# Patient Record
Sex: Male | Born: 1995 | Race: Black or African American | Hispanic: No | Marital: Single | Smoking: Never smoker
Health system: Southern US, Community
[De-identification: ages and names within clinical notes are randomized; demographics above are authoritative.]

## PROBLEM LIST (undated history)

## (undated) DIAGNOSIS — R002 Palpitations: Secondary | ICD-10-CM

## (undated) DIAGNOSIS — R569 Unspecified convulsions: Secondary | ICD-10-CM

---
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
## 2017-09-26 ENCOUNTER — Emergency Department (HOSPITAL_COMMUNITY)
Admission: EM | Admit: 2017-09-26 | Discharge: 2017-09-26 | Disposition: A | Discharge status: Home / Self Care | Payer: BLUE CROSS/BLUE SHIELD | Attending: Emergency Medicine | Admitting: Emergency Medicine

## 2017-09-26 ENCOUNTER — Encounter (HOSPITAL_COMMUNITY): Payer: Self-pay | Admitting: *Deleted

## 2017-09-26 ENCOUNTER — Other Ambulatory Visit: Payer: Self-pay

## 2017-09-26 DIAGNOSIS — R002 Palpitations: Secondary | ICD-10-CM | POA: Diagnosis present

## 2017-09-26 LAB — CBC WITH DIFFERENTIAL/PLATELET
Basophils Absolute: 0 10*3/uL (ref 0.0–0.1)
Basophils Relative: 0 %
Eosinophils Absolute: 0 10*3/uL (ref 0.0–0.7)
Eosinophils Relative: 0 %
HCT: 38.2 % — ABNORMAL LOW (ref 39.0–52.0)
Hemoglobin: 12.7 g/dL — ABNORMAL LOW (ref 13.0–17.0)
Lymphocytes Relative: 29 %
Lymphs Abs: 2.5 10*3/uL (ref 0.7–4.0)
MCH: 27.1 pg (ref 26.0–34.0)
MCHC: 33.2 g/dL (ref 30.0–36.0)
MCV: 81.6 fL (ref 78.0–100.0)
Monocytes Absolute: 0.6 10*3/uL (ref 0.1–1.0)
Monocytes Relative: 7 %
Neutro Abs: 5.4 10*3/uL (ref 1.7–7.7)
Neutrophils Relative %: 64 %
Platelets: 333 10*3/uL (ref 150–400)
RBC: 4.68 MIL/uL (ref 4.22–5.81)
RDW: 14.1 % (ref 11.5–15.5)
WBC: 8.5 10*3/uL (ref 4.0–10.5)

## 2017-09-26 LAB — BASIC METABOLIC PANEL
Anion gap: 8 (ref 5–15)
BUN: 8 mg/dL (ref 6–20)
CO2: 26 mmol/L (ref 22–32)
Calcium: 9.1 mg/dL (ref 8.9–10.3)
Chloride: 103 mmol/L (ref 101–111)
Creatinine, Ser: 1.07 mg/dL (ref 0.61–1.24)
GFR calc Af Amer: >60 mL/min (ref >60)
GFR calc non Af Amer: >60 mL/min (ref >60)
Glucose, Bld: 125 mg/dL — ABNORMAL HIGH (ref 65–99)
Potassium: 3 mmol/L — ABNORMAL LOW (ref 3.5–5.1)
Sodium: 137 mmol/L (ref 135–145)

## 2017-09-26 MED ORDER — POTASSIUM CHLORIDE CRYS ER 20 MEQ PO TBCR
60.0000 meq | EXTENDED_RELEASE_TABLET | Freq: Once | ORAL | Status: AC
  Administered 2017-09-26: 60 meq via ORAL
  Filled 2017-09-26: qty 3

## 2017-09-26 NOTE — ED Triage Notes (Signed)
Pt states he ate a bowl of cereal today and after lying down he noticed he started having heart palpitations and abdominal pain with some nausea

## 2017-10-01 NOTE — ED Provider Notes (Signed)
[facility] EMERGENCY DEPARTMENT Provider Note   CSN: [id number] Arrival date & time: 09/26/17  2021     History   Chief Complaint Chief Complaint  Patient presents with  . Palpitations    HPI Brandon Rose is a 22 y.o. male.  HPI   22 year old male with palpitations.  Onset while laying down shortly after eating earlier today.  He felt like his heart was racing.  Mild nausea.  Denies any pain.  No dyspnea.  Has a past history of palpitations but states that this felt a little bit different.  Currently feeling better.  He was in his usual state of health over the past few days.  No unusual leg pain or swelling.  No fevers or chills.  No cough.  Past Medical History:  Diagnosis Date  . Heart palpitations   . Seizures (HCC)    as a infant    There are no active problems to display for this patient.   History reviewed. No pertinent surgical history.      Home Medications    Prior to Admission medications   Not on File    Family History History reviewed. No pertinent family history.  Social History Social History   Tobacco Use  . Smoking status: Never Smoker  . Smokeless tobacco: Never Used  Substance Use Topics  . Alcohol use: No  . Drug use: No     Allergies   Patient has no known allergies.   Review of Systems Review of Systems  All systems reviewed and negative, other than as noted in HPI.  Physical Exam Updated Vital Signs BP (!) 98/51   Pulse 72   Temp 98.8 F (37.1 C) (Oral)   Resp (!) 25   Wt 63.5 kg (140 lb)   SpO2 100%   Physical Exam  Constitutional: He appears well-developed and well-nourished. No distress.  HENT:  Head: Normocephalic and atraumatic.  Eyes: Conjunctivae are normal. Right eye exhibits no discharge. Left eye exhibits no discharge.  Neck: Neck supple.  Cardiovascular: Normal rate, regular rhythm and normal heart sounds. Exam reveals no gallop and no friction rub.  No murmur heard. Pulmonary/Chest: Effort  normal and breath sounds normal. No respiratory distress.  Abdominal: Soft. He exhibits no distension. There is no tenderness.  Musculoskeletal: He exhibits no edema or tenderness.  Neurological: He is alert.  Skin: Skin is warm and dry.  Psychiatric: He has a normal mood and affect. His behavior is normal. Thought content normal.  Nursing note and vitals reviewed.    ED Treatments / Results  Labs (all labs ordered are listed, but only abnormal results are displayed) Labs Reviewed  CBC WITH DIFFERENTIAL/PLATELET - Abnormal; Notable for the following components:      Result Value   Hemoglobin 12.7 (*)    HCT 38.2 (*)    All other components within normal limits  BASIC METABOLIC PANEL - Abnormal; Notable for the following components:   Potassium 3.0 (*)    Glucose, Bld 125 (*)    All other components within normal limits    EKG EKG Interpretation  Date/Time:  Thursday Sep 26 2017 20:42:44 EDT Ventricular Rate:  90 PR Interval:    QRS Duration: 89 QT Interval:  353 QTC Calculation: 432 R Axis:   85 Text Interpretation:  Sinus rhythm Probable left ventricular hypertrophy Confirmed by Ross Marcus ([id number]) on 09/27/2017 7:47:52 PM   Radiology No results found.  Procedures Procedures (including critical care time)  Medications Ordered in  ED Medications  potassium chloride SA (K-DUR,KLOR-CON) CR tablet 60 mEq (60 mEq Oral Given 09/26/17 2206)     Initial Impression / Assessment and Plan / ED Course  I have reviewed the triage vital signs and the nursing notes.  Pertinent labs & imaging results that were available during my care of the patient were reviewed by me and considered in my medical decision making (see chart for details).     22 year old male with palpitations.  He dynamically stable.  Appears well.  Mild hypokalemia.  This is supplemented.  EKG not concerning.  Low suspicion for emergent process.  Return precautions were discussed.  Final Clinical  Impressions(s) / ED Diagnoses   Final diagnoses:  Heart palpitations  Palpitations    ED Discharge Orders    None       Raeford Razor, MD 10/01/17 [phone]

## 2018-06-04 ENCOUNTER — Emergency Department (HOSPITAL_COMMUNITY)
Admission: EM | Admit: 2018-06-04 | Discharge: 2018-06-04 | Disposition: A | Discharge status: Home / Self Care | Payer: Self-pay | Attending: Emergency Medicine | Admitting: Emergency Medicine

## 2018-06-04 ENCOUNTER — Other Ambulatory Visit: Payer: Self-pay

## 2018-06-04 ENCOUNTER — Emergency Department (HOSPITAL_COMMUNITY): Payer: Self-pay

## 2018-06-04 ENCOUNTER — Encounter (HOSPITAL_COMMUNITY): Payer: Self-pay | Admitting: Emergency Medicine

## 2018-06-04 DIAGNOSIS — R002 Palpitations: Secondary | ICD-10-CM | POA: Insufficient documentation

## 2018-06-04 DIAGNOSIS — R0789 Other chest pain: Secondary | ICD-10-CM | POA: Insufficient documentation

## 2018-06-04 LAB — BASIC METABOLIC PANEL
Anion gap: 8 (ref 5–15)
BUN: 12 mg/dL (ref 6–20)
CHLORIDE: 105 mmol/L (ref 98–111)
CO2: 25 mmol/L (ref 22–32)
CREATININE: 0.82 mg/dL (ref 0.61–1.24)
Calcium: 9.3 mg/dL (ref 8.9–10.3)
GFR calc non Af Amer: >60 mL/min (ref >60)
Glucose, Bld: 99 mg/dL (ref 70–99)
POTASSIUM: 3.8 mmol/L (ref 3.5–5.1)
SODIUM: 138 mmol/L (ref 135–145)

## 2018-06-04 LAB — CBC
HEMATOCRIT: 41.7 % (ref 39.0–52.0)
Hemoglobin: 12.9 g/dL — ABNORMAL LOW (ref 13.0–17.0)
MCH: 26.4 pg (ref 26.0–34.0)
MCHC: 30.9 g/dL (ref 30.0–36.0)
MCV: 85.5 fL (ref 80.0–100.0)
NRBC: 0 % (ref 0.0–0.2)
Platelets: 316 10*3/uL (ref 150–400)
RBC: 4.88 MIL/uL (ref 4.22–5.81)
RDW: 14.6 % (ref 11.5–15.5)
WBC: 5.3 10*3/uL (ref 4.0–10.5)

## 2018-06-04 LAB — TROPONIN I: Troponin I: <0.03 ng/mL (ref <0.03)

## 2018-06-04 NOTE — ED Triage Notes (Signed)
Patient c/o palpitations yesterday.

## 2018-06-04 NOTE — ED Provider Notes (Signed)
[facility] EMERGENCY DEPARTMENT Provider Note   CSN: [id number] Arrival date & time: 06/04/18  1705     History   Chief Complaint Chief Complaint  Patient presents with  . Chest Pain    HPI Brandon Rose is a 23 y.o. male with a past medical history of intermittent palpitations presents to ED for palpitations that occurred approximately 24 hours ago yesterday.  States that he was on his break at work when he was sitting down and began experiencing like his heart was pounding, chest tightness.  Reports history of similar symptoms in the past with no specific cause.  He is seen pediatric cardiology 2 years ago with negative and reassuring work-up.  He notes that he still somewhat has the symptoms currently but "not that bad, it was really bad last night."  He denies any fever, URI symptoms, shortness of breath, wheezing, abdominal pain, vomiting, syncope.  He denies any alcohol, tobacco or other drug use.  No family history of sudden cardiac death at a young age.  HPI  Past Medical History:  Diagnosis Date  . Heart palpitations   . Seizures (HCC)    as a infant    There are no active problems to display for this patient.   History reviewed. No pertinent surgical history.      Home Medications    Prior to Admission medications   Not on File    Family History History reviewed. No pertinent family history.  Social History Social History   Tobacco Use  . Smoking status: Never Smoker  . Smokeless tobacco: Never Used  Substance Use Topics  . Alcohol use: No  . Drug use: No     Allergies   Patient has no known allergies.   Review of Systems Review of Systems  Constitutional: Negative for appetite change, chills and fever.  HENT: Negative for ear pain, rhinorrhea, sneezing and sore throat.   Eyes: Negative for photophobia and visual disturbance.  Respiratory: Positive for chest tightness. Negative for cough, shortness of breath and wheezing.     Cardiovascular: Positive for palpitations. Negative for chest pain.  Gastrointestinal: Negative for abdominal pain, blood in stool, constipation, diarrhea, nausea and vomiting.  Genitourinary: Negative for dysuria, hematuria and urgency.  Musculoskeletal: Negative for myalgias.  Skin: Negative for rash.  Neurological: Negative for dizziness, weakness and light-headedness.     Physical Exam Updated Vital Signs BP 140/62 (BP Location: Right Arm)   Pulse 78   Temp 97.9 F (36.6 C) (Oral)   Resp 18   Ht 5\' 9"  (1.753 m)   Wt 68 kg   SpO2 100%   BMI 22.15 kg/m   Physical Exam Vitals signs and nursing note reviewed.  Constitutional:      General: He is not in acute distress.    Appearance: He is well-developed.     Comments: Nontoxic-appearing in no acute distress.  Speaking in complete sentences without difficulty.  HENT:     Head: Normocephalic and atraumatic.     Nose: Nose normal.  Eyes:     General: No scleral icterus.       Left eye: No discharge.     Conjunctiva/sclera: Conjunctivae normal.  Neck:     Musculoskeletal: Normal range of motion and neck supple.  Cardiovascular:     Rate and Rhythm: Normal rate and regular rhythm.     Heart sounds: Normal heart sounds. No murmur. No friction rub. No gallop.   Pulmonary:     Effort: Pulmonary  effort is normal. No respiratory distress.     Breath sounds: Normal breath sounds.  Abdominal:     General: Bowel sounds are normal. There is no distension.     Palpations: Abdomen is soft.     Tenderness: There is no abdominal tenderness. There is no guarding.  Musculoskeletal: Normal range of motion.     Comments: No lower extremity edema, erythema or calf tenderness bilaterally.  Skin:    General: Skin is warm and dry.     Findings: No rash.  Neurological:     Mental Status: He is alert.     Motor: No abnormal muscle tone.     Coordination: Coordination normal.      ED Treatments / Results  Labs (all labs ordered are  listed, but only abnormal results are displayed) Labs Reviewed  CBC - Abnormal; Notable for the following components:      Result Value   Hemoglobin 12.9 (*)    All other components within normal limits  BASIC METABOLIC PANEL  TROPONIN I    EKG None      Radiology Dg Chest 2 View  Result Date: 06/04/2018 CLINICAL DATA:  Palpitations, chest pain and shortness of breath since yesterday EXAM: CHEST - 2 VIEW COMPARISON:  06/24/2015 FINDINGS: Normal heart size, mediastinal contours, and pulmonary vascularity. Lungs clear. No pleural effusion or pneumothorax. Bones unremarkable. IMPRESSION: Normal exam. Electronically Signed   By: Ulyses SouthwardMark  Boles M.D.   On: 06/04/2018 17:35    Procedures Procedures (including critical care time)  Medications Ordered in ED Medications - No data to display   Initial Impression / Assessment and Plan / ED Course  I have reviewed the triage vital signs and the nursing notes.  Pertinent labs & imaging results that were available during my care of the patient were reviewed by me and considered in my medical decision making (see chart for details).  Clinical Course as of Jun 04 2004  Wed Jun 04, 2018  1837 EKG unable to be crossed over.  I have attached an image.  EKG shows normal sinus rhythm, normal EKG.  Signed off by Dr. Charm BargesButler.   [HK]  1858 Notes from peds cardiology visit 05/2015  Impressions   Brandon Rose evaluation is consistent with a normal (a.k.a., innocent) heart murmur. A normal murmur may, however, persist, and be more prominent under conditions of increased hemodynamic stress (e.g., febrile illness, heavy exertion, anemia, or dehydration). As this murmur is a normal finding, it should not impact Brandon Rose general health, growth or development, or athletic performance.  Upon my review, Brandon Rose has had a normal ECG both here and at [facility]. His palpitations are brief, declining in frequency, and likely reflect rare PACs or PVCs. 40 seconds of ECG  monitoring in our office reveal no ectopy. It is normal in the teenage years to start having rare PACs and/or PVCs, and unless frequent or increasing with exercise are not of concern. Brandon Rose' chest pain sounds non-cardiac in origin, and echocardiogram today to evaluate murmur and possible valve click (which can be suggestive of bicuspid aortic valve) demonstrated no cause for chest pain and a normal aortic valve. I do not feel further work-up for the palpitations is warranted at this time, unless they become frequent, increase with exercise, or he experiences sustained palpitations/tachycardia, at which point we could consider further monitoring or exercise testing. I did encourage Brandon Rose to hydrate well throughout the day and especially with activity, and to try to get a good night's sleep.  Recommendations    No further work-up at this time.   I would not limit Brandon Rose in sports participation or physical activity from the perspective of the heart.   No SBE prophylaxis required.  No scheduled follow-up with Pediatric Cardiology is required, however, we would be happy to see Brandon Rose should any new or concerning symptoms develop, or symptoms of palpitations progress, at which point his family can contact us and we can consider monitor or exercise test.     [HK]    Clinical Course User Index [HK] Dietrich Pates, PA-C    23 year old male presents to ED for palpitations, chest tightness that began 24 hours ago.  Patient has been seen and evaluated in the ED about 4 times for similar symptoms in the past 2 to 3 years.  Reassuring work-up noted.  He has also seen the pediatric cardiologist in the past with reassuring work-up.  His symptoms began 24 hours ago and have gradually improved throughout the day.  Patient states that "I am here to just get checked out."  He denies any history of MI, DVT, PE, recent immobilization, shortness of breath or URI symptoms.  On my exam he is overall well-appearing.   Vital signs are within normal limits.  He is not tachycardic, tachypneic or hypoxic.  No lower extremity edema, erythema or calf tenderness that would concern me for DVT.  Chest is not tender to palpation.  Plan is to obtain lab work, imaging and reassess.   EKG shows normal sinus rhythm with no ischemic changes.  Chest x-ray is unremarkable. CBC, BMP, unremarkable.  Troponin is negative.  Feel that symptoms are noncardiac in origin.  Doubt ACS this patient as low risk, he is PERC negative.  Reviewed pediatric cardiology note with notable findings as above.  Will advise patient to follow-up with PCP and to return to ED for any severe worsening symptoms.  Patient is hemodynamically stable, in NAD, and able to ambulate in the ED. Evaluation does not show pathology that would require ongoing emergent intervention or inpatient treatment. I explained the diagnosis to the patient. Pain has been managed and has no complaints prior to discharge. Patient is comfortable with above plan and is stable for discharge at this time. All questions were answered prior to disposition. Strict return precautions for returning to the ED were discussed. Encouraged follow up with PCP.    Portions of this note were generated with Scientist, clinical (histocompatibility and immunogenetics). Dictation errors may occur despite best attempts at proofreading.  Final Clinical Impressions(s) / ED Diagnoses   Final diagnoses:  Palpitations    ED Discharge Orders    None       Dietrich Pates, PA-C 06/04/18 2006    Maia Plan, MD 06/05/18 1310

## 2018-06-04 NOTE — Discharge Instructions (Signed)
Follow-up with your primary care provider. Return to ED for worsening symptoms, increased chest pain, shortness of breath, feeling like you are going to pass out, leg swelling or coughing up blood.

## 2018-06-04 NOTE — ED Triage Notes (Signed)
Called for room, no answer

## 2021-05-20 ENCOUNTER — Emergency Department (HOSPITAL_COMMUNITY)
Admission: EM | Admit: 2021-05-20 | Discharge: 2021-05-20 | Disposition: A | Discharge status: Home / Self Care | Payer: Self-pay | Attending: Emergency Medicine | Admitting: Emergency Medicine

## 2021-05-20 ENCOUNTER — Emergency Department (HOSPITAL_COMMUNITY): Payer: Self-pay

## 2021-05-20 ENCOUNTER — Encounter (HOSPITAL_COMMUNITY): Payer: Self-pay | Admitting: *Deleted

## 2021-05-20 ENCOUNTER — Other Ambulatory Visit: Payer: Self-pay

## 2021-05-20 DIAGNOSIS — R0602 Shortness of breath: Secondary | ICD-10-CM | POA: Insufficient documentation

## 2021-05-20 DIAGNOSIS — R42 Dizziness and giddiness: Secondary | ICD-10-CM | POA: Insufficient documentation

## 2021-05-20 DIAGNOSIS — R002 Palpitations: Secondary | ICD-10-CM | POA: Insufficient documentation

## 2021-05-20 DIAGNOSIS — R0789 Other chest pain: Secondary | ICD-10-CM | POA: Insufficient documentation

## 2021-05-20 LAB — BASIC METABOLIC PANEL
Anion gap: 9 (ref 5–15)
BUN: 14 mg/dL (ref 6–20)
CO2: 25 mmol/L (ref 22–32)
Calcium: 9.2 mg/dL (ref 8.9–10.3)
Chloride: 102 mmol/L (ref 98–111)
Creatinine, Ser: 0.91 mg/dL (ref 0.61–1.24)
GFR, Estimated: >60 mL/min (ref >60)
Glucose, Bld: 120 mg/dL — ABNORMAL HIGH (ref 70–99)
Potassium: 4 mmol/L (ref 3.5–5.1)
Sodium: 136 mmol/L (ref 135–145)

## 2021-05-20 LAB — CBC WITH DIFFERENTIAL/PLATELET
Abs Immature Granulocytes: 0.01 10*3/uL (ref 0.00–0.07)
Basophils Absolute: 0 10*3/uL (ref 0.0–0.1)
Basophils Relative: 1 %
Eosinophils Absolute: 0 10*3/uL (ref 0.0–0.5)
Eosinophils Relative: 0 %
HCT: 40.4 % (ref 39.0–52.0)
Hemoglobin: 13.3 g/dL (ref 13.0–17.0)
Immature Granulocytes: 0 %
Lymphocytes Relative: 26 %
Lymphs Abs: 1.2 10*3/uL (ref 0.7–4.0)
MCH: 27.9 pg (ref 26.0–34.0)
MCHC: 32.9 g/dL (ref 30.0–36.0)
MCV: 84.7 fL (ref 80.0–100.0)
Monocytes Absolute: 0.3 10*3/uL (ref 0.1–1.0)
Monocytes Relative: 6 %
Neutro Abs: 3.1 10*3/uL (ref 1.7–7.7)
Neutrophils Relative %: 67 %
Platelets: 296 10*3/uL (ref 150–400)
RBC: 4.77 MIL/uL (ref 4.22–5.81)
RDW: 13.3 % (ref 11.5–15.5)
WBC: 4.6 10*3/uL (ref 4.0–10.5)
nRBC: 0 % (ref 0.0–0.2)

## 2021-05-20 LAB — TROPONIN I (HIGH SENSITIVITY)
Troponin I (High Sensitivity): 5 ng/L (ref <18)
Troponin I (High Sensitivity): 7 ng/L (ref <18)

## 2021-05-20 NOTE — ED Triage Notes (Signed)
C/o heart beating fast and shortness of breath

## 2021-05-20 NOTE — Discharge Instructions (Signed)
You came to the emergency department today to be evaluated for your palpitations.  Your physical exam and lab work were reassuring.  Your chest x-ray showed no abnormalities to your heart or lungs.  The exact cause of your palpitations are unknown at this time.  I have placed a referral to cardiology.  They will contact you in the next 2-3 business days to schedule a follow-up appointment.  Get help right away if: You have chest pain or shortness of breath. You have a severe headache. You feel dizzy or you faint.

## 2021-05-20 NOTE — ED Provider Notes (Signed)
[facility] EMERGENCY DEPARTMENT Provider Note   CSN: [id number] Arrival date & time: 05/20/21  1242     History Chief Complaint  Patient presents with   Tachycardia    Brandon Rose is a 25 y.o. male with a history of heart palpitations.  Presents emergency department with a chief complaint of palpitations.  Patient states that today at approximately 1040 he woke from sleep.  Patient states that he felt like he was having palpitations.  Patient stood out of bed and had worsening of symptoms.  Patient reports associated shortness of breath, chest tightness, lightheadedness, and tingling sensation throughout his body.  Symptoms lasted approximately 5 to 10 minutes and resolved upon EMS arrival.  Patient reports that this feels similar to previous episodes of palpitations he has had in the past.  Patient reports that chest tightness was started as entire chest.  Patient Dors is increased anxiety over the last week.  Denies any leg swelling or tenderness, palpitations, hemoptysis, history of DVT or PE, hormone use, recent surgery or trauma, abdominal pain, nausea, vomiting, diaphoresis, syncope.  Denies any history of illicit drug use or tobacco use.  Endorses drinking 1 or less alcoholic drinks per week.  No family history of cardiac events related 55.  HPI     Past Medical History:  Diagnosis Date   Heart palpitations    Seizures ([place])    as a infant    There are no problems to display for this patient.   History reviewed. No pertinent surgical history.     No family history on file.  Social History   Tobacco Use   Smoking status: Never   Smokeless tobacco: Never  Vaping Use   Vaping Use: Every day  Substance Use Topics   Alcohol use: Not Currently   Drug use: No    Home Medications Prior to Admission medications   Not on File    Allergies    Nickel  Review of Systems   Review of Systems  Constitutional:  Negative for chills and fever.  HENT:   Negative for congestion, rhinorrhea and sore throat.   Eyes:  Negative for visual disturbance.  Respiratory:  Positive for chest tightness and shortness of breath. Negative for cough.   Cardiovascular:  Negative for chest pain, palpitations and leg swelling.  Gastrointestinal:  Negative for abdominal pain, nausea and vomiting.  Genitourinary:  Negative for difficulty urinating and dysuria.  Musculoskeletal:  Negative for back pain and neck pain.  Skin:  Negative for color change and rash.  Neurological:  Negative for dizziness, tremors, seizures, syncope, facial asymmetry, speech difficulty, weakness, light-headedness, numbness and headaches.  Psychiatric/Behavioral:  Negative for confusion.    Physical Exam Updated Vital Signs BP 118/69    Pulse 66    Temp 97.9 F (36.6 C)    Resp 20    Ht 5\' 8"  (1.727 m)    Wt 71.7 kg    SpO2 100%    BMI 24.02 kg/m   Physical Exam Vitals and nursing note reviewed.  Constitutional:      General: He is not in acute distress.    Appearance: He is not ill-appearing, toxic-appearing or diaphoretic.  Eyes:     General: No scleral icterus.       Right eye: No discharge.        Left eye: No discharge.     Extraocular Movements: Extraocular movements intact.     Pupils: Pupils are equal, round, and reactive to light.  Cardiovascular:     Rate and Rhythm: Normal rate.     Pulses:          Radial pulses are 2+ on the right side and 2+ on the left side.     Heart sounds: Normal heart sounds, S1 normal and S2 normal. No murmur heard. Pulmonary:     Effort: Pulmonary effort is normal. No tachypnea, bradypnea or respiratory distress.  Abdominal:     Palpations: Abdomen is soft.     Tenderness: There is no abdominal tenderness.  Musculoskeletal:     Cervical back: Normal range of motion and neck supple. No rigidity.  Skin:    General: Skin is warm and dry.  Neurological:     General: No focal deficit present.     Mental Status: He is alert.     GCS:  GCS eye subscore is 4. GCS verbal subscore is 5. GCS motor subscore is 6.     Cranial Nerves: No cranial nerve deficit, dysarthria or facial asymmetry.     Sensory: Sensation is intact.     Motor: No weakness, tremor, seizure activity or pronator drift.     Coordination: Romberg sign negative. Finger-Nose-Finger Test normal.     Gait: Gait is intact. Gait normal.     Comments: CN II-XII intact, equal grip strength, +5 strength to bilateral upper and lower extremities, sensation to light touch intact to bilateral upper and lower extremities  Psychiatric:        Behavior: Behavior is cooperative.    ED Results / Procedures / Treatments   Labs (all labs ordered are listed, but only abnormal results are displayed) Labs Reviewed  BASIC METABOLIC PANEL - Abnormal; Notable for the following components:      Result Value   Glucose, Bld 120 (*)    All other components within normal limits  CBC WITH DIFFERENTIAL/PLATELET  TROPONIN I (HIGH SENSITIVITY)  TROPONIN I (HIGH SENSITIVITY)    EKG EKG Interpretation  Date/Time:  Saturday May 20 2021 12:54:55 EST Ventricular Rate:  71 PR Interval:  177 QRS Duration: 90 QT Interval:  384 QTC Calculation: 418 R Axis:   86 Text Interpretation: Sinus rhythm ST elev, probable normal early repol pattern Confirmed by Milton Ferguson [phone]) on 05/20/2021 1:09:54 PM  Radiology DG Chest 2 View  Result Date: 05/20/2021 CLINICAL DATA:  Chest pain, cough, shortness of breath EXAM: CHEST - 2 VIEW COMPARISON:  06/04/2018 FINDINGS: The heart size and mediastinal contours are within normal limits. Both lungs are clear. The visualized skeletal structures are unremarkable. IMPRESSION: No active cardiopulmonary disease. Electronically Signed   By: Jerilynn Mages.  Shick M.D.   On: 05/20/2021 14:02    Procedures Procedures   Medications Ordered in ED Medications - No data to display  ED Course  I have reviewed the triage vital signs and the nursing  notes.  Pertinent labs & imaging results that were available during my care of the patient were reviewed by me and considered in my medical decision making (see chart for details).    MDM Rules/Calculators/A&P                          Alert 25 year old male no acute stress, nontoxic-appearing.  Presents to the emergency department chief plaint of palpitations.  Patient has history of the same.  States that palpitations felt similar to previous episodes.  S1, S2 present with no murmurs rubs or gallops.  +2 radial pulse bilaterally.  Lungs clear  to auscultation bilaterally.  No pulsatile mass to abdomen.  EKG shows sinus rhythm with early repool pattern.  Similar to previous tracings.  Plan to check orthostatic vital signs has symptoms became worse with standing.  Will obtain basic lab work, chest x-ray, and troponin.  Chest x-ray shows no active cardiopulmonary disease Troponin 5 and 7 with delta of +2.  Heart score of 2.  Low suspicion for ACS at this time.  BMP and CBC are unremarkable No orthostatic hypotension with orthostatic vitals.  Patient has no further episodes of palpitations while in the emergency department.  Patient hemodynamically stable.  Will place ambulatory referral for cardiology.  Patient given strict return precautions.  Discussed results, findings, treatment and follow up. Patient advised of return precautions. Patient verbalized understanding and agreed with plan.      Final Clinical Impression(s) / ED Diagnoses Final diagnoses:  Palpitations    Rx / DC Orders ED Discharge Orders          Ordered    Ambulatory referral to Cardiology        05/20/21 [address] 05/20/21 Patience Musca, MD 05/21/21 [phone]
